# Patient Record
Sex: Male | Born: 1970 | Race: White | Hispanic: No | Smoking: Never smoker
Health system: Southern US, Community
[De-identification: ages and names within clinical notes are randomized; demographics above are authoritative.]

## PROBLEM LIST (undated history)

## (undated) DIAGNOSIS — K219 Gastro-esophageal reflux disease without esophagitis: Secondary | ICD-10-CM

---

## 2014-06-25 ENCOUNTER — Encounter (HOSPITAL_COMMUNITY): Payer: Self-pay | Admitting: Emergency Medicine

## 2014-06-25 ENCOUNTER — Emergency Department (HOSPITAL_COMMUNITY)
Admission: EM | Admit: 2014-06-25 | Discharge: 2014-06-26 | Disposition: A | Discharge status: Home / Self Care | Payer: 59 | Attending: Emergency Medicine | Admitting: Emergency Medicine

## 2014-06-25 ENCOUNTER — Emergency Department (HOSPITAL_COMMUNITY): Payer: 59

## 2014-06-25 DIAGNOSIS — K219 Gastro-esophageal reflux disease without esophagitis: Secondary | ICD-10-CM | POA: Insufficient documentation

## 2014-06-25 DIAGNOSIS — R079 Chest pain, unspecified: Secondary | ICD-10-CM | POA: Diagnosis present

## 2014-06-25 DIAGNOSIS — Z79899 Other long term (current) drug therapy: Secondary | ICD-10-CM | POA: Insufficient documentation

## 2014-06-25 DIAGNOSIS — R0789 Other chest pain: Secondary | ICD-10-CM | POA: Insufficient documentation

## 2014-06-25 HISTORY — DX: Gastro-esophageal reflux disease without esophagitis: K21.9

## 2014-06-25 LAB — BASIC METABOLIC PANEL
Anion gap: 9 (ref 5–15)
BUN: 11 mg/dL (ref 6–23)
CO2: 25 mmol/L (ref 19–32)
Calcium: 9.3 mg/dL (ref 8.4–10.5)
Chloride: 105 mmol/L (ref 96–112)
Creatinine, Ser: 1.1 mg/dL (ref 0.50–1.35)
GFR calc Af Amer: >90 mL/min (ref >90)
GFR, EST NON AFRICAN AMERICAN: 80 mL/min — AB (ref >90)
GLUCOSE: 98 mg/dL (ref 70–99)
POTASSIUM: 3.4 mmol/L — AB (ref 3.5–5.1)
Sodium: 139 mmol/L (ref 135–145)

## 2014-06-25 LAB — CBC
HEMATOCRIT: 44.5 % (ref 39.0–52.0)
Hemoglobin: 15.4 g/dL (ref 13.0–17.0)
MCH: 29.8 pg (ref 26.0–34.0)
MCHC: 34.6 g/dL (ref 30.0–36.0)
MCV: 86.2 fL (ref 78.0–100.0)
PLATELETS: 208 10*3/uL (ref 150–400)
RBC: 5.16 MIL/uL (ref 4.22–5.81)
RDW: 12.6 % (ref 11.5–15.5)
WBC: 9.7 10*3/uL (ref 4.0–10.5)

## 2014-06-25 LAB — I-STAT TROPONIN, ED: TROPONIN I, POC: 0 ng/mL (ref 0.00–0.08)

## 2014-06-25 LAB — D-DIMER, QUANTITATIVE (NOT AT ARMC)

## 2014-06-25 MED ORDER — GI COCKTAIL ~~LOC~~
30.0000 mL | Freq: Once | ORAL | Status: AC
  Administered 2014-06-25: 30 mL via ORAL
  Filled 2014-06-25: qty 30

## 2014-06-25 MED ORDER — ASPIRIN 81 MG PO CHEW
324.0000 mg | CHEWABLE_TABLET | Freq: Once | ORAL | Status: AC
  Administered 2014-06-25: 324 mg via ORAL
  Filled 2014-06-25: qty 4

## 2014-06-25 MED ORDER — SUCRALFATE 1 GM/10ML PO SUSP: 1.0000 g | Freq: Three times a day (TID) | ORAL | Status: AC

## 2014-06-25 NOTE — ED Notes (Signed)
Patient here with complaint of chest pain accompanied by nausea and lighteadedness. States that the pain began about 1 month ago. Was seen by MD and referred to GI. Is currently taking protonix and metoclopramide. States he presents tonight because pain has become more intense and is agrivated by exertion.

## 2014-06-25 NOTE — ED Provider Notes (Signed)
CSN: [id number]     Arrival date & time 06/25/14  2100 History   First MD Initiated Contact with Patient 06/25/14 2136     Chief Complaint  Patient presents with  . Chest Pain  . Nausea     The history is provided by the patient. No language interpreter was used.   Mr. Mazzola presents for evaluation of chest pain. He has a history of GERD and takes Protonix daily. He was seen a year ago by gastroenterology in her upper and lower GI that was negative. Over the last month he's had increased pain in his chest is described as a burning sensation. He is scheduled to have an outpatient endoscopy on April 8 for these symptoms. Over the last few days he's had increased pain in his central chest and bilateral shoulders. The pain is waxing and waning. It is present in the began today and progresses throughout the course the day and is worse with activity. He reports associated nausea and occasional diaphoresis. He denies any shortness of breath, hemoptysis, abdominal pain, vomiting, diarrhea, leg swelling or pain. He feels lightheaded intermittently. His mother had cardiac disease in her 5s. He is a [employer].  Past Medical History  Diagnosis Date  . GERD (gastroesophageal reflux disease)    History reviewed. No pertinent past surgical history. No family history on file. History  Substance Use Topics  . Smoking status: Never Smoker   . Smokeless tobacco: Not on file  . Alcohol Use: No    Review of Systems  All other systems reviewed and are negative.     Allergies  Review of patient's allergies indicates no known allergies.  Home Medications   Prior to Admission medications   Medication Sig Start Date End Date Taking? Authorizing Provider  cetirizine (ZYRTEC) 10 MG tablet Take 10 mg by mouth daily.   Yes Historical Provider, MD  GARLIC PO Take 1 capsule by mouth daily.   Yes Historical Provider, MD  metoCLOPramide (REGLAN) 10 MG tablet Take 10 mg by mouth 3 (three) times daily  before meals.   Yes Historical Provider, MD  Multiple Vitamin (MULTIVITAMIN WITH MINERALS) TABS tablet Take 1 tablet by mouth daily.   Yes Historical Provider, MD  pantoprazole (PROTONIX) 40 MG tablet Take 40 mg by mouth 2 (two) times daily.   Yes Historical Provider, MD   BP 143/101 mmHg  Pulse 89  Temp(Src) 98.4 F (36.9 C) (Oral)  Resp 18  Ht  (1.803 m)  Wt 250 lb (113.399 kg)  BMI 34.88 kg/m2  SpO2 98% Physical Exam  Constitutional: He is oriented to person, place, and time. He appears well-developed and well-nourished.  HENT:  Head: Normocephalic and atraumatic.  Cardiovascular: Normal rate and regular rhythm.   No murmur heard. Pulmonary/Chest: Effort normal and breath sounds normal. No respiratory distress.  Abdominal: Soft. There is no tenderness. There is no rebound and no guarding.  Musculoskeletal: He exhibits no edema or tenderness.  Neurological: He is alert and oriented to person, place, and time.  Skin: Skin is warm and dry.  Psychiatric: He has a normal mood and affect. His behavior is normal.  Nursing note and vitals reviewed.   ED Course  Procedures (including critical care time) Labs Review Labs Reviewed  BASIC METABOLIC PANEL - Abnormal; Notable for the following:    Potassium 3.4 (*)    GFR calc non Af Amer 80 (*)    All other components within normal limits  CBC  D-DIMER, QUANTITATIVE  Rosezena SensorI-STAT TROPOININ, ED    Imaging Review No results found.   EKG Interpretation   Date/Time:  Tuesday June 25 2014 21:04:00 EDT Ventricular Rate:  92 PR Interval:  136 QRS Duration: 88 QT Interval:  344 QTC Calculation: 425 R Axis:   24 Text Interpretation:  Normal sinus rhythm Normal ECG Confirmed by Lincoln Brighamees,  Liz [phone]([id number]) on 06/25/2014 9:37:27 PM      MDM   Final diagnoses:  Atypical chest pain  Gastroesophageal reflux disease, esophagitis presence not specified    Patient is here for evaluation of chest pain that is been ongoing for the last  month and worsening lately. He currently currently takes Protonix for reflux. History and presentation is not consistent with ACS, PE, dissection, CHF. Patient's symptoms are improved following GI cocktail. Discussed with patient on care for reflux with continuing his Protonix. Recommend adding Carafate and close PCP follow-up as well as GI follow-up. Discussed dietary changes for reflux.    Tilden FossaElizabeth Anyely Cunning, MD 06/25/14 [phone]

## 2014-06-25 NOTE — Discharge Instructions (Signed)
Food Choices for Gastroesophageal Reflux Disease When you have gastroesophageal reflux disease (GERD), the foods you eat and your eating habits are very important. Choosing the right foods can help ease the discomfort of GERD. WHAT GENERAL GUIDELINES DO I NEED TO FOLLOW?  Choose fruits, vegetables, whole grains, low-fat dairy products, and low-fat meat, fish, and poultry.  Limit fats such as oils, salad dressings, butter, nuts, and avocado.  Keep a food diary to identify foods that cause symptoms.  Avoid foods that cause reflux. These may be different for different people.  Eat frequent small meals instead of three large meals each day.  Eat your meals slowly, in a relaxed setting.  Limit fried foods.  Cook foods using methods other than frying.  Avoid drinking alcohol.  Avoid drinking large amounts of liquids with your meals.  Avoid bending over or lying down until 2-3 hours after eating. WHAT FOODS ARE NOT RECOMMENDED? The following are some foods and drinks that may worsen your symptoms: Vegetables Tomatoes. Tomato juice. Tomato and spaghetti sauce. Chili peppers. Onion and garlic. Horseradish. Fruits Oranges, grapefruit, and lemon (fruit and juice). Meats High-fat meats, fish, and poultry. This includes hot dogs, ribs, ham, sausage, salami, and bacon. Dairy Whole milk and chocolate milk. Sour cream. Cream. Butter. Ice cream. Cream cheese.  Beverages Coffee and tea, with or without caffeine. Carbonated beverages or energy drinks. Condiments Hot sauce. Barbecue sauce.  Sweets/Desserts Chocolate and cocoa. Donuts. Peppermint and spearmint. Fats and Oils High-fat foods, including French fries and potato chips. Other Vinegar. Strong spices, such as black pepper, white pepper, red pepper, cayenne, curry powder, cloves, ginger, and chili powder. The items listed above may not be a complete list of foods and beverages to avoid. Contact your dietitian for more  information. Document Released: 03/22/2005 Document Revised: 03/27/2013 Document Reviewed: 01/24/2013 [employer] Patient Information 2015 [employer]. This information is not intended to replace advice given to you by your health care provider. Make sure you discuss any questions you have with your health care provider. Gastroesophageal Reflux Disease, Adult Gastroesophageal reflux disease (GERD) happens when acid from your stomach flows up into the esophagus. When acid comes in contact with the esophagus, the acid causes soreness (inflammation) in the esophagus. Over time, GERD may create small holes (ulcers) in the lining of the esophagus. CAUSES   Increased body weight. This puts pressure on the stomach, making acid rise from the stomach into the esophagus.  Smoking. This increases acid production in the stomach.  Drinking alcohol. This causes decreased pressure in the lower esophageal sphincter (valve or ring of muscle between the esophagus and stomach), allowing acid from the stomach into the esophagus.  Late evening meals and a full stomach. This increases pressure and acid production in the stomach.  A malformed lower esophageal sphincter. Sometimes, no cause is found. SYMPTOMS   Burning pain in the lower part of the mid-chest behind the breastbone and in the mid-stomach area. This may occur twice a week or more often.  Trouble swallowing.  Sore throat.  Dry cough.  Asthma-like symptoms including chest tightness, shortness of breath, or wheezing. DIAGNOSIS  Your caregiver may be able to diagnose GERD based on your symptoms. In some cases, X-rays and other tests may be done to check for complications or to check the condition of your stomach and esophagus. TREATMENT  Your caregiver may recommend over-the-counter or prescription medicines to help decrease acid production. Ask your caregiver before starting or adding any new medicines.  HOME CARE   INSTRUCTIONS   Change the  factors that you can control. Ask your caregiver for guidance concerning weight loss, quitting smoking, and alcohol consumption.  Avoid foods and drinks that make your symptoms worse, such as:  Caffeine or alcoholic drinks.  Chocolate.  Peppermint or mint flavorings.  Garlic and onions.  Spicy foods.  Citrus fruits, such as oranges, lemons, or limes.  Tomato-based foods such as sauce, chili, salsa, and pizza.  Fried and fatty foods.  Avoid lying down for the 3 hours prior to your bedtime or prior to taking a nap.  Eat small, frequent meals instead of large meals.  Wear loose-fitting clothing. Do not wear anything tight around your waist that causes pressure on your stomach.  Raise the head of your bed 6 to 8 inches with wood blocks to help you sleep. Extra pillows will not help.  Only take over-the-counter or prescription medicines for pain, discomfort, or fever as directed by your caregiver.  Do not take aspirin, ibuprofen, or other nonsteroidal anti-inflammatory drugs (NSAIDs). SEEK IMMEDIATE MEDICAL CARE IF:   You have pain in your arms, neck, jaw, teeth, or back.  Your pain increases or changes in intensity or duration.  You develop nausea, vomiting, or sweating (diaphoresis).  You develop shortness of breath, or you faint.  Your vomit is green, yellow, black, or looks like coffee grounds or blood.  Your stool is red, bloody, or black. These symptoms could be signs of other problems, such as heart disease, gastric bleeding, or esophageal bleeding. MAKE SURE YOU:   Understand these instructions.  Will watch your condition.  Will get help right away if you are not doing well or get worse. Document Released: 12/30/2004 Document Revised: 06/14/2011 Document Reviewed: 10/09/2010 [employer] Patient Information 2015 [employer]. This information is not intended to replace advice given to you by your health care provider. Make sure you discuss any questions you have  with your health care provider.  

## 2016-09-17 IMAGING — CR DG CHEST 2V
2 series · 2 of 2 positions shown · non-contrast
Comparison: None.

CLINICAL DATA: Mid chest pain for 1 month, increasing in severity
today. Nausea.

EXAM:
CHEST  2 VIEW

[chest pa]
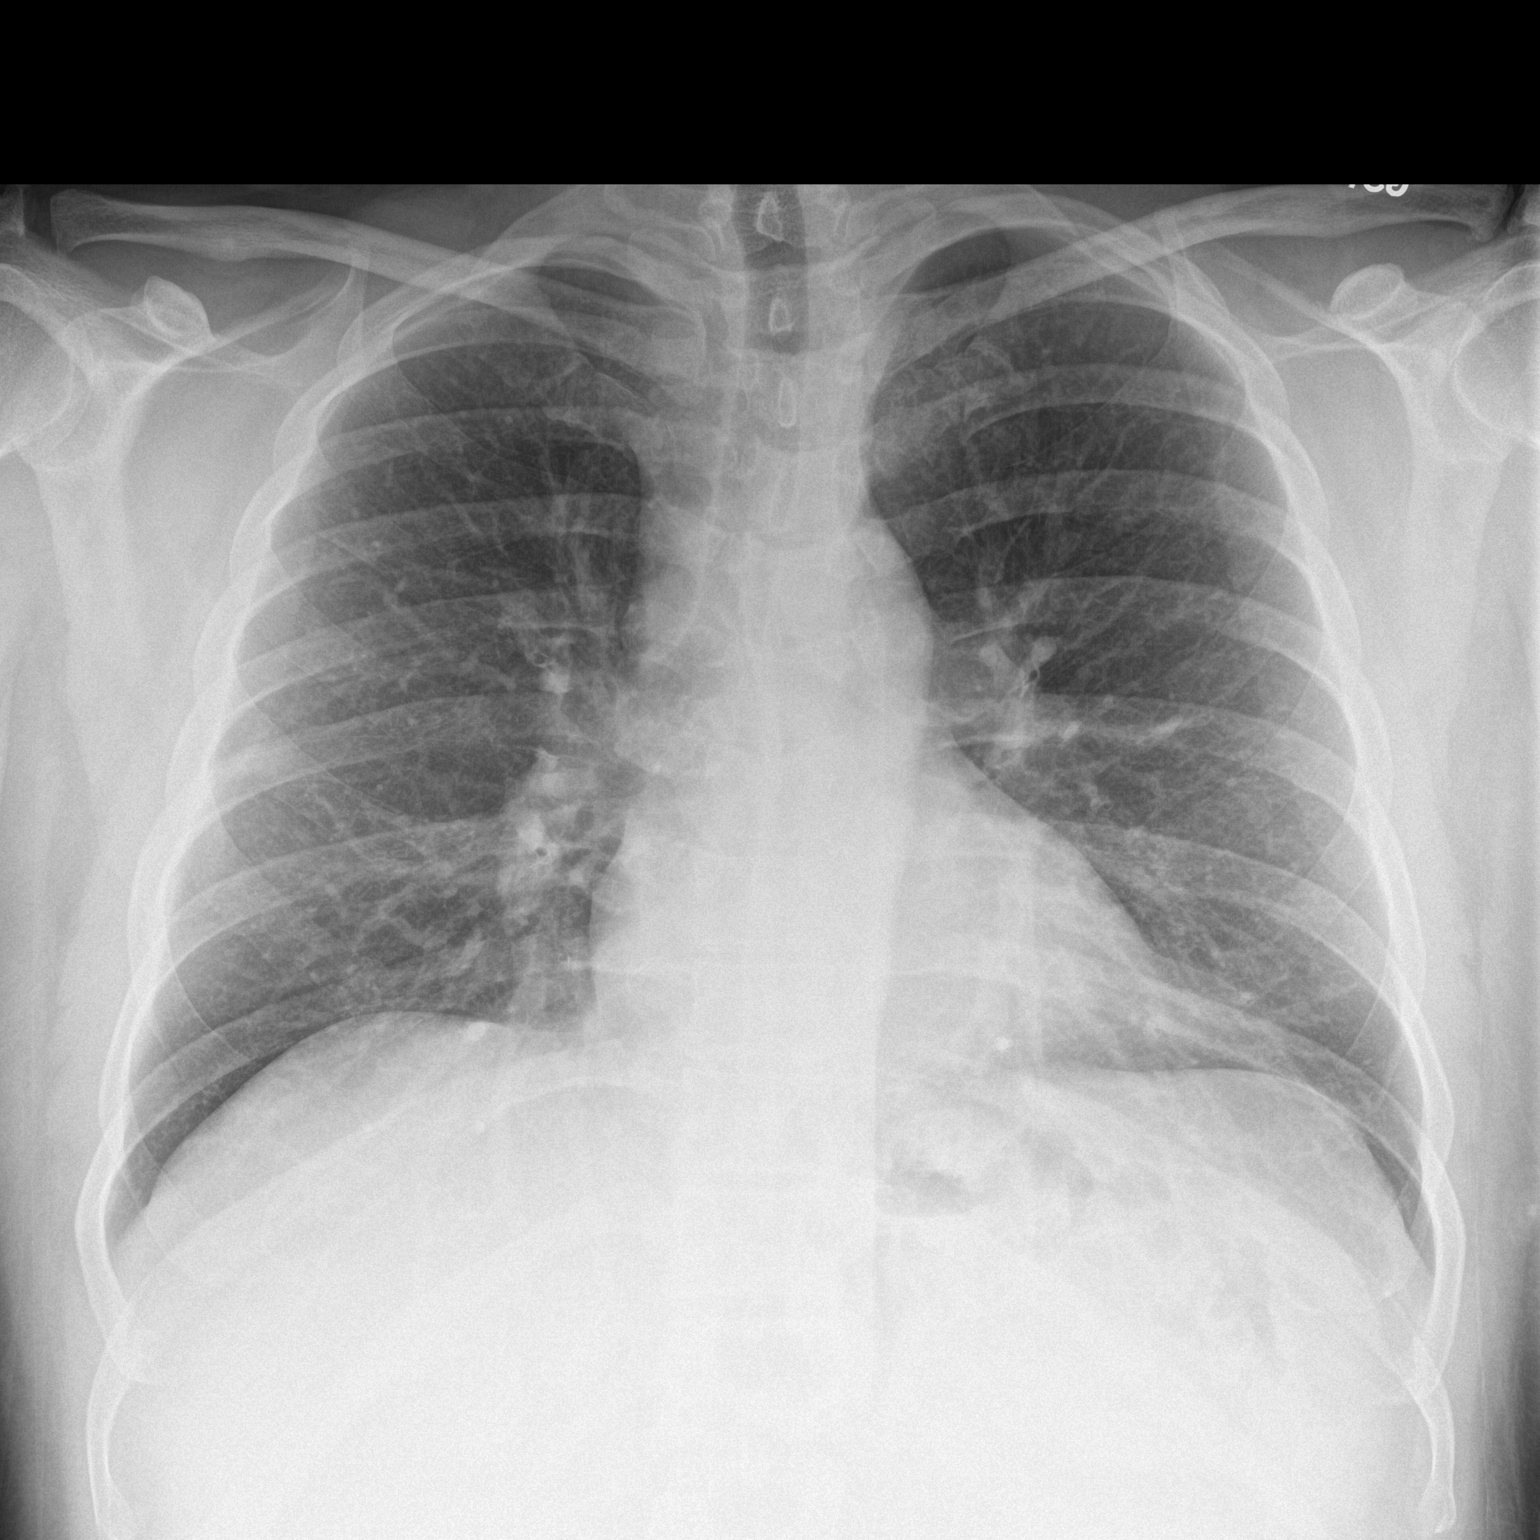

[chest lat]
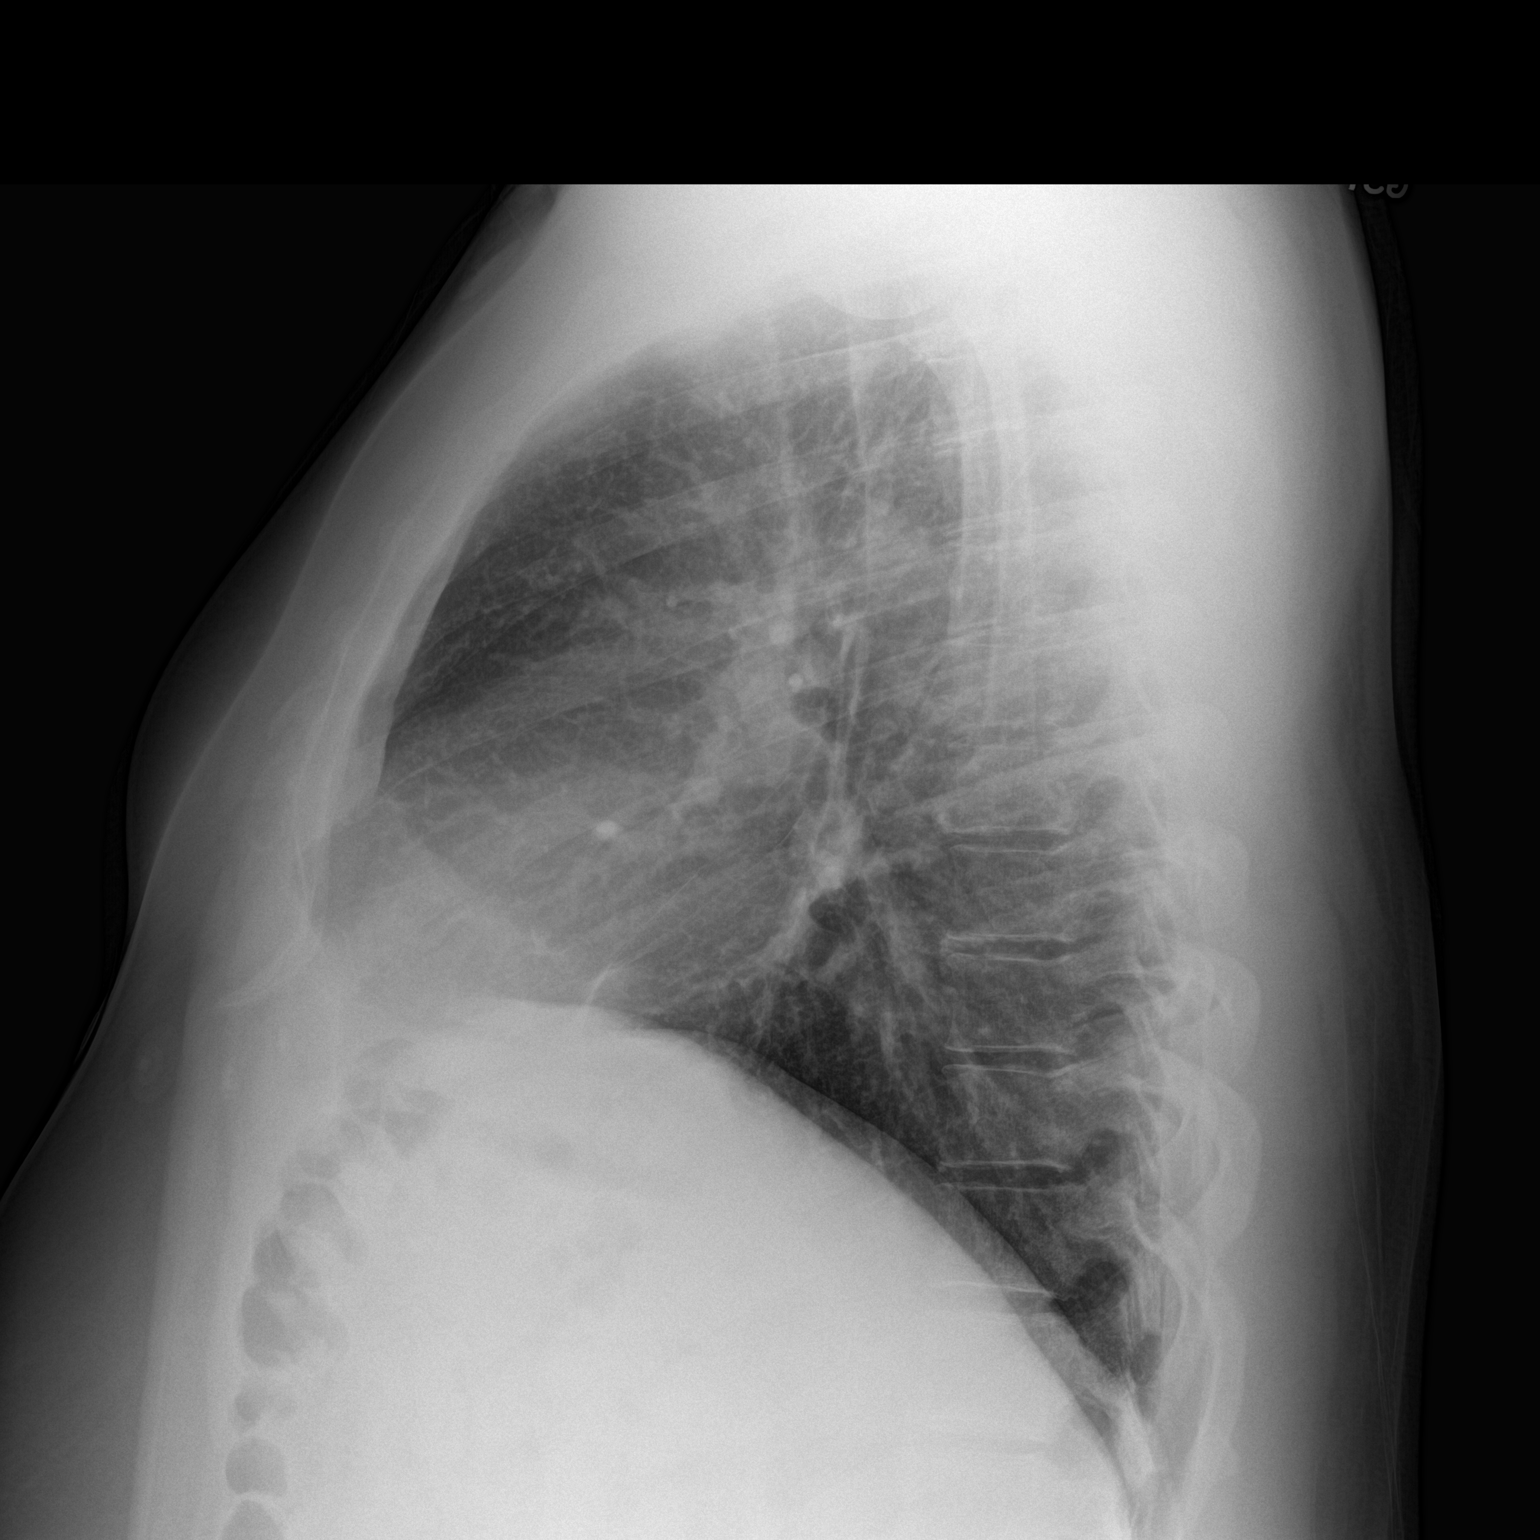

[2 of 2 positions shown; findings below may reference images not displayed]

FINDINGS: The cardiomediastinal contours are normal. Linear opacities in the
lingula likely atelectasis. Pulmonary vasculature is normal. No
consolidation, pleural effusion, or pneumothorax. No acute osseous
abnormalities are seen.
IMPRESSION: Atelectasis in the lingula, otherwise unremarkable chest
radiographs.
